# Patient Record
Sex: Female | Born: 1955 | Race: White | Hispanic: No | Marital: Married | Smoking: Never smoker
Health system: Southern US, Academic
[De-identification: ages and names within clinical notes are randomized; demographics above are authoritative.]

## PROBLEM LIST (undated history)

## (undated) HISTORY — PX: HX BREAST BIOPSY: SHX20

## (undated) HISTORY — PX: HX HYSTERECTOMY: SHX81

## (undated) HISTORY — PX: REPLACEMENT TOTAL KNEE BILATERAL: SUR1225

---

## 2008-05-19 ENCOUNTER — Other Ambulatory Visit: Payer: Self-pay

## 2008-05-23 LAB — HISTORICAL CYTOPATHOLOGY-GYN (PAP AND HPV TESTS)

## 2009-05-22 ENCOUNTER — Other Ambulatory Visit: Payer: Self-pay

## 2009-05-23 LAB — HISTORICAL CYTOPATHOLOGY-GYN (PAP AND HPV TESTS)

## 2010-05-23 ENCOUNTER — Other Ambulatory Visit: Payer: Self-pay

## 2010-05-27 LAB — HISTORICAL CYTOPATHOLOGY-GYN (PAP AND HPV TESTS)

## 2010-09-06 ENCOUNTER — Ambulatory Visit (HOSPITAL_COMMUNITY): Payer: Self-pay | Admitting: Surgery

## 2010-09-10 ENCOUNTER — Other Ambulatory Visit: Payer: Self-pay

## 2010-09-12 LAB — HISTORICAL SURGICAL PATHOLOGY SPECIMEN

## 2011-05-27 LAB — HISTORICAL CYTOPATHOLOGY-GYN (PAP AND HPV TESTS): Final Diagnosis: NEGATIVE

## 2011-06-16 IMAGING — CR DG CERVICAL SPINE COMPLETE 4+V
7 series · 7 of 7 positions shown · non-contrast
Comparison: None available.

CLINICAL DATA: Neck pain.

CERVICAL SPINE - COMPLETE 4+ VIEW

[w c-spine lat *]
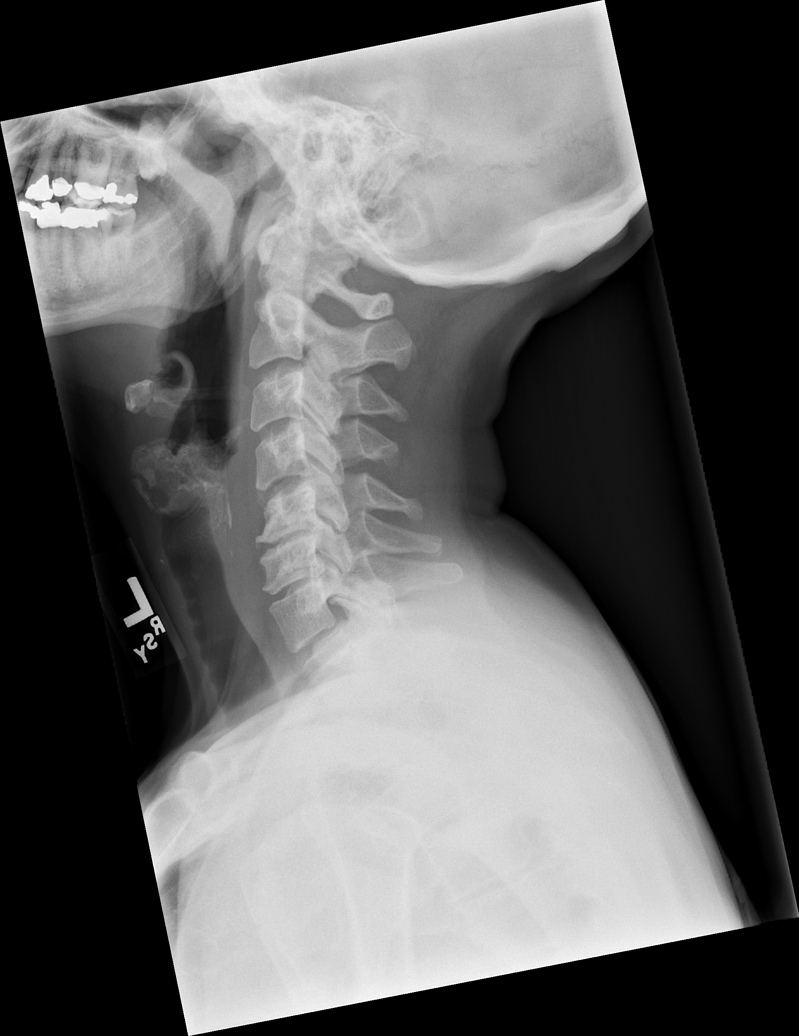

[w c-spine oblique (1 of 2)]
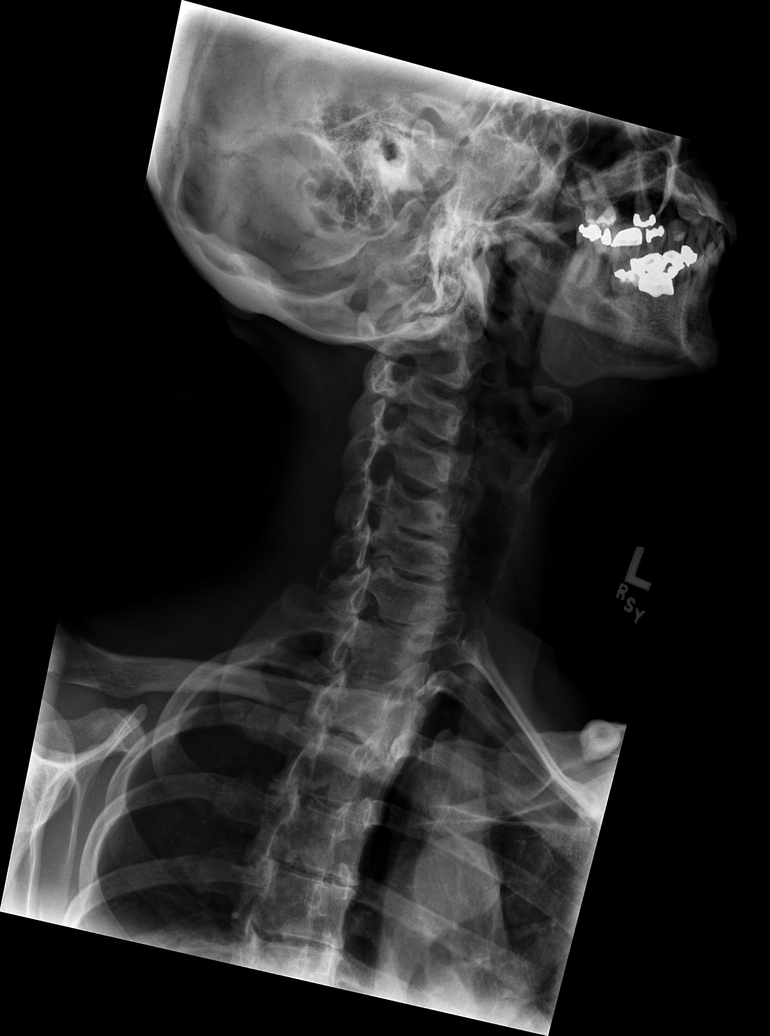

[w c-spine oblique *]
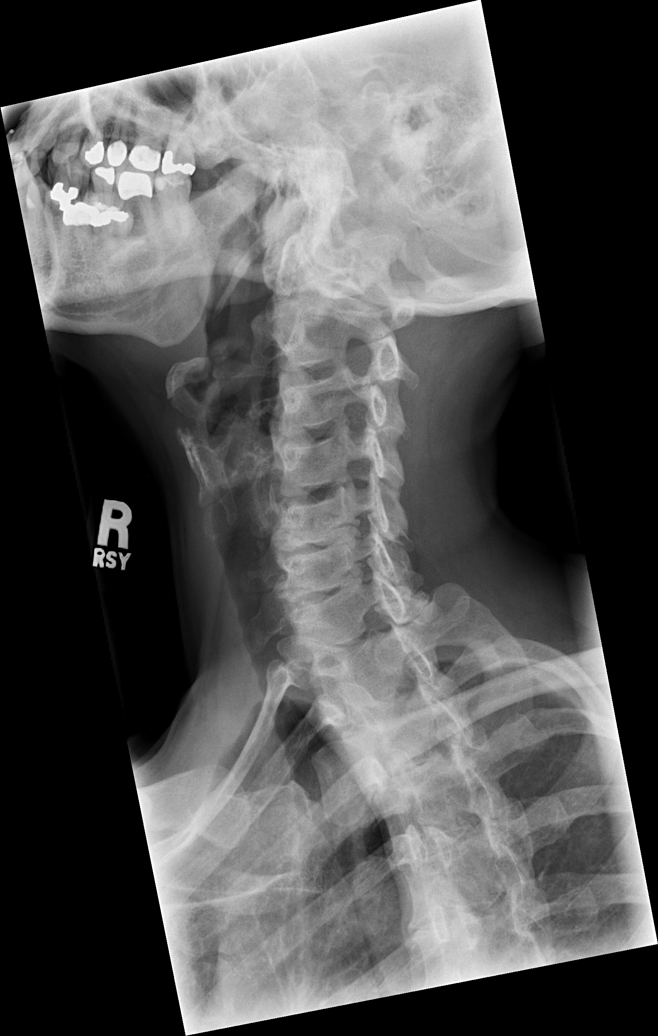

[w c-spine a.p.]
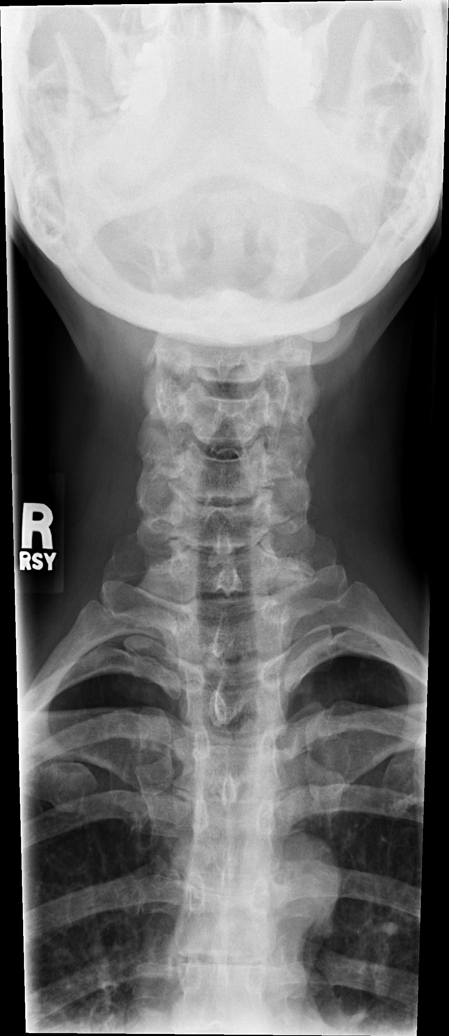

[w c-spine odontoid]
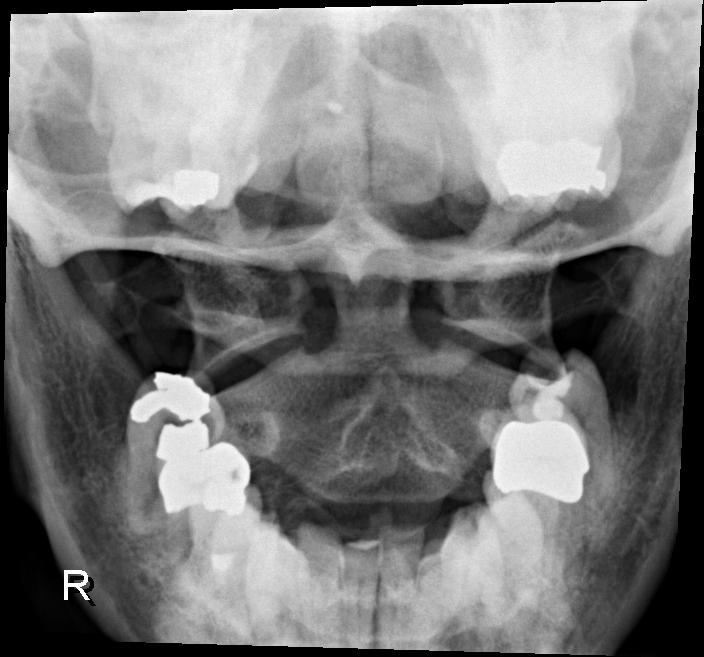

[w swimmers view]
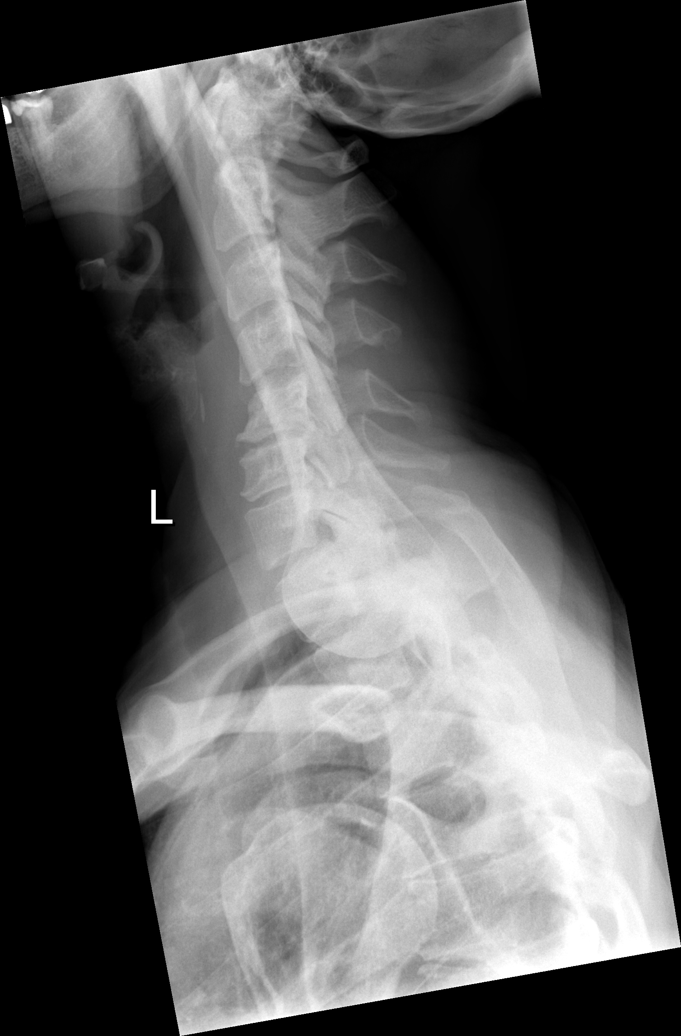

[w c-spine oblique (2 of 2)]
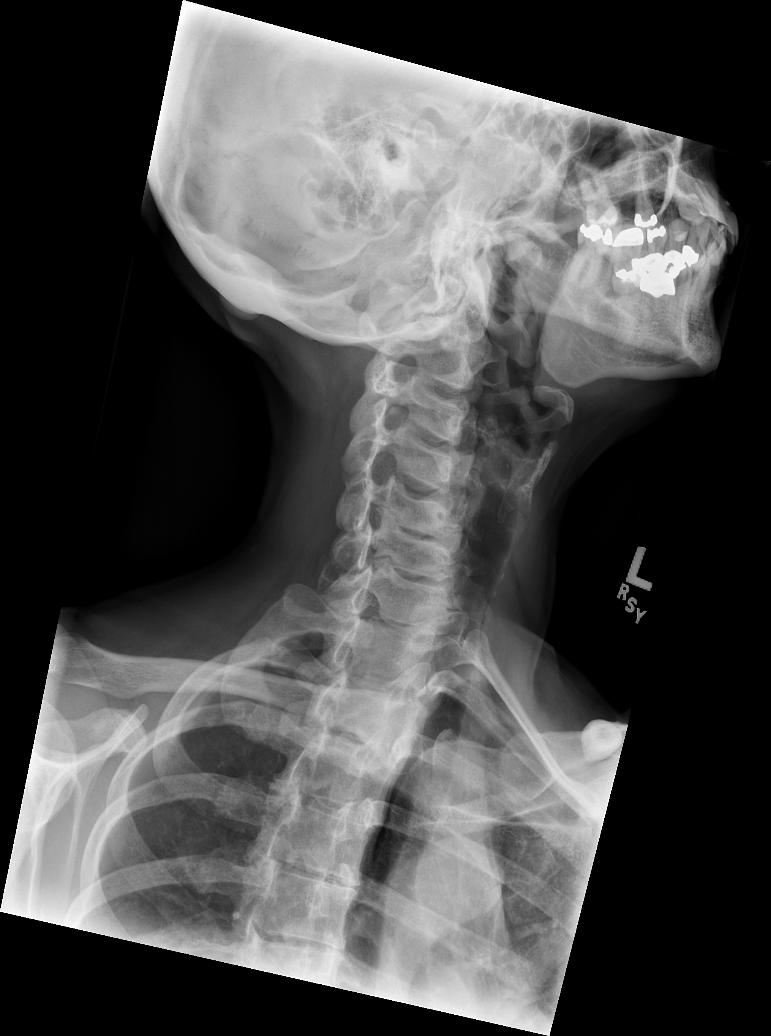

[7 of 7 positions shown; findings below may reference images not displayed]

FINDINGS: Vertebral body height and alignment are maintained.  The
patient has degenerative disease at C5-6 where there is marked loss
of disc space height and endplate spurring.  Uncovertebral disease
is also seen at this level and at C6-7.  Foramina appear narrowed
at both C5-6 and C6-7.
IMPRESSION: 1.  No acute finding.
2.  Cervical spondylosis appearing worst at C5-6 and C6-7.

## 2012-10-27 ENCOUNTER — Other Ambulatory Visit: Payer: Self-pay

## 2012-10-28 LAB — HISTORICAL SURGICAL PATHOLOGY SPECIMEN

## 2016-09-15 ENCOUNTER — Other Ambulatory Visit (HOSPITAL_COMMUNITY): Payer: Self-pay | Admitting: FAMILY PRACTICE

## 2016-09-15 DIAGNOSIS — Z1231 Encounter for screening mammogram for malignant neoplasm of breast: Secondary | ICD-10-CM

## 2016-10-06 ENCOUNTER — Encounter (HOSPITAL_BASED_OUTPATIENT_CLINIC_OR_DEPARTMENT_OTHER): Payer: Self-pay

## 2016-10-06 ENCOUNTER — Ambulatory Visit
Admission: RE | Admit: 2016-10-06 | Discharge: 2016-10-06 | Disposition: A | Discharge status: Home / Self Care | Source: Ambulatory Visit | Attending: FAMILY PRACTICE | Admitting: FAMILY PRACTICE

## 2016-10-06 DIAGNOSIS — Z1231 Encounter for screening mammogram for malignant neoplasm of breast: Secondary | ICD-10-CM

## 2017-09-17 ENCOUNTER — Other Ambulatory Visit (HOSPITAL_COMMUNITY): Payer: Self-pay | Admitting: FAMILY PRACTICE

## 2017-09-17 DIAGNOSIS — Z1239 Encounter for other screening for malignant neoplasm of breast: Secondary | ICD-10-CM

## 2017-10-29 ENCOUNTER — Ambulatory Visit
Admission: RE | Admit: 2017-10-29 | Discharge: 2017-10-29 | Disposition: A | Discharge status: Home / Self Care | Source: Ambulatory Visit | Attending: FAMILY PRACTICE | Admitting: FAMILY PRACTICE

## 2017-10-29 ENCOUNTER — Encounter (HOSPITAL_BASED_OUTPATIENT_CLINIC_OR_DEPARTMENT_OTHER): Payer: Self-pay

## 2017-10-29 DIAGNOSIS — Z1239 Encounter for other screening for malignant neoplasm of breast: Secondary | ICD-10-CM

## 2017-10-29 DIAGNOSIS — Z1231 Encounter for screening mammogram for malignant neoplasm of breast: Secondary | ICD-10-CM | POA: Insufficient documentation

## 2018-02-08 ENCOUNTER — Emergency Department (HOSPITAL_COMMUNITY): Admitting: Radiology

## 2018-02-08 ENCOUNTER — Emergency Department (HOSPITAL_COMMUNITY)

## 2018-02-08 ENCOUNTER — Encounter (HOSPITAL_COMMUNITY): Payer: Self-pay

## 2018-02-08 ENCOUNTER — Emergency Department
Admission: EM | Admit: 2018-02-08 | Discharge: 2018-02-08 | Disposition: A | Discharge status: Home / Self Care | Attending: Internal Medicine | Admitting: Internal Medicine

## 2018-02-08 ENCOUNTER — Other Ambulatory Visit: Payer: Self-pay

## 2018-02-08 DIAGNOSIS — Z96653 Presence of artificial knee joint, bilateral: Secondary | ICD-10-CM | POA: Insufficient documentation

## 2018-02-08 DIAGNOSIS — S8392XA Sprain of unspecified site of left knee, initial encounter: Principal | ICD-10-CM | POA: Insufficient documentation

## 2018-02-08 DIAGNOSIS — Z880 Allergy status to penicillin: Secondary | ICD-10-CM | POA: Insufficient documentation

## 2018-02-08 MED ORDER — TRAMADOL 50 MG TABLET
1.0000 | ORAL_TABLET | Freq: Four times a day (QID) | ORAL | 0 refills | Status: DC | PRN
Start: 2018-02-08 — End: 2023-04-07

## 2018-02-08 MED ORDER — IBUPROFEN 800 MG TABLET
800.0000 mg | ORAL_TABLET | Freq: Three times a day (TID) | ORAL | 0 refills | Status: DC | PRN
Start: 2018-02-08 — End: 2023-04-07

## 2018-02-08 NOTE — ED Provider Notes (Signed)
Emergency Department  Provider Note  HPI - 02/08/2018    Name: Mary Briggs  Age and Gender: 62 y.o. female  Attending: Dr. Yong Channel  APP: Kirke Shaggy PA-C  Scribe: Mickey Farber    Chief Complaint   Patient presents with   . Knee Pain       HPI:    Entered the patient's room for initial exam at 1025 hours.    Mary Briggs is a 62 y.o. female  who presents to the ED today for left knee pain. Patient reports that she has been experiencing left knee pain since this morning. She states that she did work outside yesterday and has concern that she may have injured the knee while doing so. She has had bilateral knee replacements. She does not have any other complaints or concerns at this time.    Please see the patient's PMH below:  Medical  Not on file   Surgical   HX BREAST BIOPSY (Right)    HX HYSTERECTOMY    Replacement total knee bilateral      PCP: Kerry Hough, MD     Location: left knee  Quality: pain  Onset: this morning  Timing: constant  Context: patient has been experiencing left knee pain    History provided by: patient     Review of Systems:    Constitutional: No fever, chills  Skin: No rashes or lesions  HENT: No sore throat, ear pain, or difficulty swallowing  Eyes: No vision changes, redness, discharge  Cardio: No chest pain, palpitations   Respiratory: No cough, wheezing or SOB  GI:  No nausea or vomiting. No diarrhea or constipation. No abdominal pain  GU:  No dysuria, hematuria, polyuria  MSK: +Left knee pain. No neck or back pain  Neuro: No numbness, tingling, or weakness.  No headache  All other systems reviewed and are negative, unless commented on in the HPI.       Below information reviewed with patient:   Current Outpatient Medications   Medication Sig   . Ibuprofen (MOTRIN) 800 mg Oral Tablet Take 1 Tab (800 mg total) by mouth Three times a day as needed for Pain   . traMADol (ULTRAM) 50 mg Oral Tablet Take 1-2 Tabs (50-100 mg total) by mouth Every 6 hours as needed for Pain  (Left knee strain)       Allergies   Allergen Reactions   . Penicillins           Past Medical History:  History reviewed. No pertinent past medical history.    Past Surgical History:  Past Surgical History:   Procedure Laterality Date   . Hx breast biopsy Right    . Hx hysterectomy     . Replacement total knee bilateral         Social History:  Social History     Tobacco Use   . Smoking status: Never Smoker   . Smokeless tobacco: Never Used   Substance Use Topics   . Alcohol use: Never     Frequency: Never   . Drug use: Never     Social History     Substance and Sexual Activity   Drug Use Never       Family History:  No family history on file.      Old records reviewed.      Objective:  Nursing notes reviewed    Filed Vitals:    02/08/18 1021   BP: (!) 141/89  Pulse: 81   Resp: 16   Temp: 36.5 C (97.7 F)   SpO2: 97%       Physical Exam  Nursing note and vitals reviewed.  Vital signs reviewed as above.     Constitutional: Pt is well-developed and well-nourished.   Head: Normocephalic and atraumatic.   Eyes/ Ears/ Throat: Conjunctivae are normal. Pupils are equal, round, and reactive to light. EOM are intact. TMs normal. No pharyngeal erythema.  Neck: Soft, supple, full range of motion.  Cardiovascular: RRR. No Murmurs/rubs/gallops. Distal pulses present and equal bilaterally.  Pulmonary/Chest: Normal BS BL with no distress. No audible wheezes or crackles are noted.  GI/ Abdominal: Soft, nontender, nondistended. No rebound, guarding, or masses.  Musculoskeletal: +Tenderness and swelling to the left knee. Good pulse and motor sensroy distally. Negative Homans sign. No deformities.   Neurological: CNs 2-12 grossly intact. No focal deficits noted.  Skin: Warm and dry. No rash or lesions    Work-up:  Orders Placed This Encounter   . XR KNEE LEFT 4 OR MORE VIEWS   . Ibuprofen (MOTRIN) 800 mg Oral Tablet   . traMADol (ULTRAM) 50 mg Oral Tablet     Imaging:  Results for orders placed or performed during the hospital  encounter of 02/08/18 (from the past 72 hour(s))   XR KNEE LEFT 4 OR MORE VIEWS     Status: None    Narrative    Female, 62 years old.    XR KNEE LEFT 4 OR MORE VIEWS performed on 02/08/2018 11:13 AM.    REASON FOR EXAM:  injury    TECHNIQUE: 4 views/4 images submitted for interpretation.    COMPARISON:  None.    FINDINGS:  4 views of the left knee were obtained. The patient has  undergone prior medial compartment arthroplasty. Prosthetic components are  well aligned. No evidence of failure. There is joint space narrowing with  marginal osteophytic spurring in the patellofemoral and lateral  compartments. The soft tissues are unremarkable.      Impression    No acute osseous abnormality.        Radiologist location ID: [id number]       Plan: Appropriate imaging ordered. Medical Records reviewed.    MDM:   During the patient's stay in the emergency department, the above listed information was used to assist with medical decision making and were reviewed by myself when available for review.      Pt remained stable throughout the emergency department course.  Patient is alert, afebrile, ambulatory and nontoxic.    Impression:   Encounter Diagnosis   Name Primary?   . Left knee sprain Yes     Disposition:  Discharged      Medication instructions were discussed with the patient   It was advised that the patient return to the ED with any new, concerning or worsening symptoms and follow up as directed.    The patient verbalized understanding of all instructions and had no further questions or concerns.     Follow up:   Antoine PrimasMcElroy, J Jeffrey, MD  [address]  [address]  [place] [id number]  [phone]    Schedule an appointment as soon as possible for a visit       Prescriptions:   Current Discharge Medication List      START taking these medications    Details   Ibuprofen (MOTRIN) 800 mg Oral Tablet Take 1 Tab (800 mg total) by mouth Three times a day as needed  for Pain  Qty: 30 Tab, Refills: 0      traMADol (ULTRAM)  50 mg Oral Tablet Take 1-2 Tabs (50-100 mg total) by mouth Every 6 hours as needed for Pain (Left knee strain)  Qty: 12 Tab, Refills: 0           I am scribing for, and in the presence of, Scott VanFossen PA-C for services provided on 02/08/2018.  Mickey Farber, SCRIBE     Mickey Farber, SCRIBE  02/08/2018, 10:15    I personally performed the services described in this documentation, as scribed  in my presence, and it is both accurate  and complete.  Stormy Card, PA-C  The co-signing faculty was physically present in the emergency department and available for consultation and did not particpate in the care of this patient.  William Hamburger Vanfossen, PA-C  02/09/2018, 10:13

## 2018-02-08 NOTE — Discharge Instructions (Signed)
Please return if worsening.  Please make follow up appointment.

## 2018-02-08 NOTE — ED Triage Notes (Signed)
Patient complains of left knee pain that started this morning. States she was working outside yesterday and does not know if she injured while working. Hx of bilateral knee replacement.

## 2018-02-25 ENCOUNTER — Ambulatory Visit (INDEPENDENT_AMBULATORY_CARE_PROVIDER_SITE_OTHER): Admitting: Surgical

## 2018-02-25 ENCOUNTER — Encounter (INDEPENDENT_AMBULATORY_CARE_PROVIDER_SITE_OTHER): Payer: Self-pay | Admitting: Surgical

## 2018-02-25 VITALS — BP 146/90 | HR 59 | Ht 63.0 in | Wt 168.8 lb

## 2018-02-25 DIAGNOSIS — Z96659 Presence of unspecified artificial knee joint: Secondary | ICD-10-CM

## 2018-02-25 DIAGNOSIS — M25562 Pain in left knee: Secondary | ICD-10-CM

## 2018-02-25 DIAGNOSIS — Z6829 Body mass index (BMI) 29.0-29.9, adult: Secondary | ICD-10-CM

## 2018-02-25 NOTE — Progress Notes (Signed)
[facility]  [address]  [place] [facility] [place] [id number]    Progress Note    Name: Mary CellarDebbie G Vandoren MRN:  [id number]   Date: 02/25/2018 Age: 62 y.o.         Reason for Visit:  Left knee pain    History of Present Illness:  Mary Briggs is a 62 y.o. 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  Patient has a history of left unicompartmental knee arthroplasty performed on 12/03/2006 by Dr. Katherene PontoMcElroy.  Patient states she was doing some external painting and was squatting up and down and she had a sudden pain in her left knee lateral aspect that radiated up to her hip.  She states it felt like a "charley horse that would not let up".  She states the next day she followed up in the emergency room and x-rays were performed and showed intact implants of her left knee.  States pain is improving but still having occasional pain which is localized more in the posterior knee.  She has had no recent swelling, erythema, or fever or chills.    Medical History   I have reviewed and updated as appropriate the past medical, surgical, family and social history today:    Past medical history:  Knee osteoarthritis     Past Surgical History:   Procedure Laterality Date   . HX BREAST BIOPSY Right     benign   . HX HYSTERECTOMY     . REPLACEMENT TOTAL KNEE BILATERAL       Current Outpatient Medications   Medication Sig   . Ibuprofen (MOTRIN) 800 mg Oral Tablet Take 1 Tab (800 mg total) by mouth Three times a day as needed for Pain   . traMADol (ULTRAM) 50 mg Oral Tablet Take 1-2 Tabs (50-100 mg total) by mouth Every 6 hours as needed for Pain (Left knee strain)     Allergies   Allergen Reactions   . Penicillins      Family Medical History:     None            Social History     Tobacco Use   . Smoking status: Never Smoker   . Smokeless tobacco: Never Used   Substance Use Topics   . Alcohol use: Never     Frequency: Never   . Drug use: Never       Review of Systems:  Constitutional: no fever,  chills, or unexpected weight loss or weight gain   HEENT: no vision problem, earaches or sore throat  Respiratory: no cough or wheeze  Cardiovascular: no chest pain, palpitations or edema  Gastrointestinal: no abdominal pain, nausea, vomiting or diarrhea   Neurological: no headaches or weakness  Psych: no anxiety or depression  All other ROS Negative    Objective     Physical Exam:  BP (!) 146/90   Pulse 59   Ht 1.6 m (5\' 3" )   Wt 76.6 kg (168 lb 12.8 oz)   BMI 29.90 kg/m         Constitutional: Patient is oriented to person, place, and time and well-developed, well-nourished, and in no distress    Musculoskeletal:  examination of the left knee reveals a well-healed incision.  There is no erythema or increased warmth or effusion noted of the left knee.  No significant tenderness is noted of the left knee presently.  Full left knee range of motion without pain.  Ligamentously stable.  No pain with McMurray's test laterally.    Neurological: Patient is alert and oriented to person, place, and time. Gait normal.     Skin: Skin is warm and dry. No rash noted. No erythema.     Psychiatric: Mood, memory and affect normal.       Imaging:   Four view x-ray of the left knee dated 02/08/2018 [facility] which showed intact unicompartmental knee implants.  Mild osteophytes of the patellofemoral and lateral compartments but overall well maintained lateral and patellofemoral joint spaces.    Assessment     ENCOUNTER DIAGNOSES     ICD-10-CM   1. Left knee pain M25.562   2. History of partial knee replacement Z96.659        Plan     Patient will monitor her left knee for continued pain or recurrence of significant pain.  She is advised to monitor knee for effusion, erythema or increased warmth.    Follow up: Return if symptoms worsen or fail to improve.    I am seeing this patient independently with co-signing supervising physician present in clinic.    Jefm BryantBrook E. Morgan, PA-C        This note was partially generated  using MModal Fluency Direct system, and there may be some incorrect words, spellings, and punctuation that were not noted in checking the note before saving

## 2018-09-23 ENCOUNTER — Other Ambulatory Visit (HOSPITAL_COMMUNITY): Payer: Self-pay | Admitting: FAMILY PRACTICE

## 2018-09-23 DIAGNOSIS — Z1231 Encounter for screening mammogram for malignant neoplasm of breast: Secondary | ICD-10-CM

## 2019-12-21 ENCOUNTER — Ambulatory Visit
Admission: RE | Admit: 2019-12-21 | Discharge: 2019-12-21 | Disposition: A | Discharge status: Home / Self Care | Source: Ambulatory Visit | Attending: FAMILY PRACTICE | Admitting: FAMILY PRACTICE

## 2019-12-21 ENCOUNTER — Other Ambulatory Visit: Payer: Self-pay

## 2019-12-21 DIAGNOSIS — Z1231 Encounter for screening mammogram for malignant neoplasm of breast: Secondary | ICD-10-CM | POA: Insufficient documentation

## 2019-12-24 ENCOUNTER — Encounter

## 2020-12-19 ENCOUNTER — Other Ambulatory Visit (HOSPITAL_COMMUNITY): Payer: Self-pay | Admitting: NURSE PRACTITIONER

## 2020-12-19 DIAGNOSIS — Z1231 Encounter for screening mammogram for malignant neoplasm of breast: Secondary | ICD-10-CM

## 2020-12-19 DIAGNOSIS — M810 Age-related osteoporosis without current pathological fracture: Secondary | ICD-10-CM

## 2021-01-17 ENCOUNTER — Ambulatory Visit (HOSPITAL_BASED_OUTPATIENT_CLINIC_OR_DEPARTMENT_OTHER): Payer: Medicare Other

## 2021-01-17 ENCOUNTER — Other Ambulatory Visit: Payer: Self-pay

## 2021-01-17 ENCOUNTER — Encounter (HOSPITAL_BASED_OUTPATIENT_CLINIC_OR_DEPARTMENT_OTHER): Payer: Self-pay

## 2021-01-17 ENCOUNTER — Ambulatory Visit
Admission: RE | Admit: 2021-01-17 | Discharge: 2021-01-17 | Disposition: A | Discharge status: Home / Self Care | Payer: Medicare Other | Source: Ambulatory Visit | Attending: NURSE PRACTITIONER | Admitting: NURSE PRACTITIONER

## 2021-01-17 DIAGNOSIS — Z1231 Encounter for screening mammogram for malignant neoplasm of breast: Secondary | ICD-10-CM | POA: Insufficient documentation

## 2021-01-17 DIAGNOSIS — M810 Age-related osteoporosis without current pathological fracture: Secondary | ICD-10-CM | POA: Insufficient documentation

## 2021-04-03 ENCOUNTER — Other Ambulatory Visit (INDEPENDENT_AMBULATORY_CARE_PROVIDER_SITE_OTHER): Payer: Self-pay | Admitting: Specialist

## 2021-04-03 DIAGNOSIS — Z96653 Presence of artificial knee joint, bilateral: Secondary | ICD-10-CM

## 2021-04-03 NOTE — Telephone Encounter (Signed)
Patient is scheduled for dental procedure s/p TKA, bilateral.     Harrie Foreman, MA  04/03/2021, 16:52

## 2021-04-04 MED ORDER — CLINDAMYCIN HCL 300 MG CAPSULE
1200.00 mg | ORAL_CAPSULE | Freq: Once | ORAL | 0 refills | Status: AC
Start: 2021-04-04 — End: 2021-04-04

## 2021-09-16 ENCOUNTER — Other Ambulatory Visit (INDEPENDENT_AMBULATORY_CARE_PROVIDER_SITE_OTHER): Payer: Self-pay | Admitting: Specialist

## 2021-09-16 MED ORDER — CLINDAMYCIN HCL 300 MG CAPSULE
ORAL_CAPSULE | ORAL | 4 refills | Status: DC
Start: 2021-09-16 — End: 2023-04-07

## 2021-09-16 NOTE — Telephone Encounter (Signed)
Patient is having a dental procedure tomorrow.    Thanks!    Denton Meek, MA

## 2022-03-12 ENCOUNTER — Other Ambulatory Visit (HOSPITAL_COMMUNITY): Payer: Self-pay | Admitting: NURSE PRACTITIONER

## 2022-03-12 DIAGNOSIS — Z1231 Encounter for screening mammogram for malignant neoplasm of breast: Secondary | ICD-10-CM

## 2022-03-20 ENCOUNTER — Inpatient Hospital Stay
Admission: RE | Admit: 2022-03-20 | Discharge: 2022-03-20 | Disposition: A | Discharge status: Home / Self Care | Payer: Medicare Other | Source: Ambulatory Visit | Attending: NURSE PRACTITIONER | Admitting: NURSE PRACTITIONER

## 2022-03-20 ENCOUNTER — Other Ambulatory Visit: Payer: Self-pay

## 2022-03-20 DIAGNOSIS — Z1231 Encounter for screening mammogram for malignant neoplasm of breast: Secondary | ICD-10-CM

## 2023-01-07 ENCOUNTER — Other Ambulatory Visit (HOSPITAL_COMMUNITY): Payer: Self-pay | Admitting: NURSE PRACTITIONER

## 2023-01-07 DIAGNOSIS — R600 Localized edema: Secondary | ICD-10-CM

## 2023-01-08 ENCOUNTER — Other Ambulatory Visit (HOSPITAL_COMMUNITY): Payer: Self-pay | Admitting: NURSE PRACTITIONER

## 2023-01-08 DIAGNOSIS — M8589 Other specified disorders of bone density and structure, multiple sites: Secondary | ICD-10-CM

## 2023-01-08 DIAGNOSIS — Z1231 Encounter for screening mammogram for malignant neoplasm of breast: Secondary | ICD-10-CM

## 2023-01-21 ENCOUNTER — Ambulatory Visit
Admission: RE | Admit: 2023-01-21 | Discharge: 2023-01-21 | Disposition: A | Discharge status: Home / Self Care | Payer: Medicare Other | Source: Ambulatory Visit | Attending: NURSE PRACTITIONER | Admitting: NURSE PRACTITIONER

## 2023-01-21 ENCOUNTER — Other Ambulatory Visit: Payer: Self-pay

## 2023-01-21 DIAGNOSIS — I361 Nonrheumatic tricuspid (valve) insufficiency: Secondary | ICD-10-CM

## 2023-01-21 DIAGNOSIS — R600 Localized edema: Secondary | ICD-10-CM | POA: Insufficient documentation

## 2023-01-21 DIAGNOSIS — I340 Nonrheumatic mitral (valve) insufficiency: Secondary | ICD-10-CM

## 2023-01-22 ENCOUNTER — Encounter (HOSPITAL_BASED_OUTPATIENT_CLINIC_OR_DEPARTMENT_OTHER): Payer: Self-pay | Admitting: Cardiovascular Disease

## 2023-01-22 NOTE — Nursing Note (Signed)
Echo results faxed to ordering provider at this time.  Danylah Holden, RN

## 2023-01-28 ENCOUNTER — Other Ambulatory Visit (HOSPITAL_COMMUNITY): Payer: Self-pay | Admitting: NURSE PRACTITIONER

## 2023-01-28 DIAGNOSIS — R931 Abnormal findings on diagnostic imaging of heart and coronary circulation: Secondary | ICD-10-CM

## 2023-01-28 DIAGNOSIS — I5189 Other ill-defined heart diseases: Secondary | ICD-10-CM

## 2023-01-28 DIAGNOSIS — I071 Rheumatic tricuspid insufficiency: Secondary | ICD-10-CM

## 2023-01-28 DIAGNOSIS — I340 Nonrheumatic mitral (valve) insufficiency: Secondary | ICD-10-CM

## 2023-03-23 ENCOUNTER — Other Ambulatory Visit: Payer: Self-pay

## 2023-03-23 ENCOUNTER — Inpatient Hospital Stay
Admission: RE | Admit: 2023-03-23 | Discharge: 2023-03-23 | Disposition: A | Discharge status: Home / Self Care | Payer: Medicare Other | Source: Ambulatory Visit | Attending: NURSE PRACTITIONER

## 2023-03-23 ENCOUNTER — Other Ambulatory Visit (HOSPITAL_COMMUNITY): Payer: Self-pay | Admitting: NURSE PRACTITIONER

## 2023-03-23 ENCOUNTER — Other Ambulatory Visit (HOSPITAL_BASED_OUTPATIENT_CLINIC_OR_DEPARTMENT_OTHER): Payer: Medicare Other | Admitting: Radiology

## 2023-03-23 DIAGNOSIS — M8589 Other specified disorders of bone density and structure, multiple sites: Secondary | ICD-10-CM

## 2023-03-23 DIAGNOSIS — Z1231 Encounter for screening mammogram for malignant neoplasm of breast: Secondary | ICD-10-CM | POA: Insufficient documentation

## 2023-04-07 ENCOUNTER — Encounter (HOSPITAL_BASED_OUTPATIENT_CLINIC_OR_DEPARTMENT_OTHER): Payer: Self-pay | Admitting: Interventional Cardiology

## 2023-04-07 ENCOUNTER — Ambulatory Visit (HOSPITAL_BASED_OUTPATIENT_CLINIC_OR_DEPARTMENT_OTHER): Payer: Medicare Other | Admitting: Interventional Cardiology

## 2023-04-07 ENCOUNTER — Other Ambulatory Visit: Payer: Self-pay

## 2023-04-07 DIAGNOSIS — I071 Rheumatic tricuspid insufficiency: Secondary | ICD-10-CM

## 2023-04-07 DIAGNOSIS — I340 Nonrheumatic mitral (valve) insufficiency: Secondary | ICD-10-CM

## 2023-04-07 DIAGNOSIS — R931 Abnormal findings on diagnostic imaging of heart and coronary circulation: Secondary | ICD-10-CM

## 2023-04-07 DIAGNOSIS — I5189 Other ill-defined heart diseases: Secondary | ICD-10-CM

## 2023-04-07 DIAGNOSIS — I371 Nonrheumatic pulmonary valve insufficiency: Secondary | ICD-10-CM

## 2023-04-07 LAB — ECG W INTERP (AMB USE ONLY)(MUSE,IN CLINIC)
Atrial Rate: 54 {beats}/min
Calculated P Axis: 66 degrees
Calculated R Axis: 32 degrees
Calculated T Axis: 46 degrees
PR Interval: 174 ms
QRS Duration: 92 ms
QT Interval: 398 ms
QTC Calculation: 377 ms
Ventricular rate: 54 {beats}/min

## 2023-04-07 NOTE — H&P (Signed)
Cardiology Consult Note  [facility]  [address]  [place] [address]  [phone]      Name: Mary Briggs                       Date of Birth: [birth year]   MRN:  [id number]                         Date of visit: 04/07/2023     PCP: Floyde Parkins, NP     Reason for Consult:  Abnormal echocardiogram    HOPI:        Mary Briggs is a  67 y.o. year old female who underwent an echocardiogram for edema.  She is a retired [employer] fundraiser.  She recently was diagnosed with hypertension was started on losartan 50 mg daily.  Subsequently she noticed she has some lower extremity edema and was started on Lasix as needed.  Because of her edema she was recommended echocardiogram    No previous history of cardiovascular disease.   She is physically active and walks 2-3 miles a day with no limitations  Her cardiovascular risk factors are significant for obesity, hypertension.  Her recent LDL is 102 with a ASCVD risk score of 8.5%  No history of diabetes or smoking    Echo was interpreted as low normal LV function, global hypokinesis mild MR  Moderate TR and mild global hypokinesis    I reviewed the films.  EF appeared to be preserved at 50-55% with no wall motion abnormalities.  Mild-to-moderate TR noted with normal RVSP    She denies any chest pain, palpitations, dizziness, loss of consciousness or shortness of breath with exertion    Past medical history:  Hypertension       Past Surgical History:   Procedure Laterality Date    HX BREAST BIOPSY Right     benign    HX HYSTERECTOMY      REPLACEMENT TOTAL KNEE BILATERAL             Current Outpatient Medications   Medication Sig    aspirin (ECOTRIN) 81 mg Oral Tablet, Delayed Release (E.C.) Take 1 Tablet (81 mg total) by mouth Once a day    calcium carbonate (TUMS) 200 mg calcium (500 mg) Oral Tablet, Chewable Chew 1 Tablet (500 mg total) Once a day    clindamycin (CLEOCIN) 300 mg Oral Capsule Take all four tablets one hour prior to the procedure.  (Patient not taking: Reported on 04/07/2023)    furosemide (LASIX) 20 mg Oral Tablet TAKE 1 TABLET BY MOUTH EVERY DAY AS NEEDED FOR SWELLING    Ibuprofen (MOTRIN) 800 mg Oral Tablet Take 1 Tab (800 mg total) by mouth Three times a day as needed for Pain (Patient not taking: Reported on 04/07/2023)    losartan (COZAAR) 50 mg Oral Tablet Take 1 Tablet (50 mg total) by mouth Once a day    multivitamin (MULTIPLE VITAMINS) Oral Tablet Take 1 Tablet by mouth Once a day    traMADol (ULTRAM) 50 mg Oral Tablet Take 1-2 Tabs (50-100 mg total) by mouth Every 6 hours as needed for Pain (Left knee strain) (Patient not taking: Reported on 04/07/2023)    VITAMIN B COMPLEX ORAL Vitamin B Complex Active 1 TAB PO Daily 90 March 12th, 2020 9:58am    zinc Sulfate (ZINCATE) 50 mg zinc (220 mg) Oral Tablet Take 1 Tablet (50 mg total)  by mouth       Allergies   Allergen Reactions    Penicillins        Family Medical History:    None           Social History     Tobacco Use    Smoking status: Never    Smokeless tobacco: Never   Substance Use Topics    Alcohol use: Never        REVIEW OF SYSTEMS:  Constitutional: No fever.   Respiratory: No shortness of breath  Cardiovascular: No chest pain palpitations, lower extremity edema.    All other systems reviewed and are negative except as noted in HOPI.    PHYSICAL EXAMINATION:     Vitals reviewed. BP 122/82 Comment: RTA  Pulse 80   Ht 1.626 m (5\' 4" )   Wt 84.8 kg (187 lb)   SpO2 97%   BMI 32.10 kg/m       Vitals Filed         04/07/2023  1253 04/07/2023  1254          BP: 120/84 122/82      Pulse: 80 --              Constitutional: She is oriented to person, place, and time.   Cardiovascular: Normal rate and rhythm without murmurs.  No JVD.  No peripheral edema is noted.  Pulmonary/Chest: Lungs clear.    Abdominal: Soft. Nontender, non distended,no organomegaly.    Labs:     No results found for this or any previous visit (from the past 24 hour(s)).    Labs:   No results found for: "HA1C",  "MICALBCREAT", "LDLCHOL", "TRIG", "HDLCHOL", "CHOLESTEROL" No results found for: "SODIUM", "POTASSIUM", "CHLORIDE", "CO2", "BUN", "CREATININE", "GFR", "CALCIUM"      Imaging:    Assessment:    Assessment/Plan   1. Abnormal echocardiogram    2. Mild mitral valve regurgitation    3. Hypokinesia of left ventricle    4. Moderate tricuspid regurgitation             Plan:  She has no clinical signs or symptoms of congestive heart  No Symptoms of angina  Heart rate and blood pressures are well controlled  She has a essentially normal LV systolic function  I recommended to repeat an echocardiogram in 1 year to assess the valvular abnormalities though in my opinion there appeared to be mild.  She does have diastolic dysfunction.  Use Lasix as needed    Encouraged her to lose weight, continue exercise and healthy diet   follow up with me as needed    Thank you very much for involving me in the care of this patient.  Please feel free to call me if I can be of any further assistance.    Electronically signed by     Aldona Bar, MD  04/07/2023, 13:07      This note may have been partially generated using MModal Fluency Direct system, and there may be some incorrect words, spellings, and punctuation that were not noted in checking the note before saving, though effort was made to avoid such errors.

## 2023-08-18 ENCOUNTER — Other Ambulatory Visit (INDEPENDENT_AMBULATORY_CARE_PROVIDER_SITE_OTHER): Payer: Self-pay | Admitting: Specialist

## 2023-08-18 DIAGNOSIS — M170 Bilateral primary osteoarthritis of knee: Secondary | ICD-10-CM

## 2023-08-18 NOTE — Telephone Encounter (Signed)
Keflex

## 2023-08-18 NOTE — Telephone Encounter (Signed)
Patient called requesting a ABX for a dentist appt.    Surgery was two knee surgeries     Dropped if okay to send     Robby Sermon, CMA

## 2023-09-18 ENCOUNTER — Other Ambulatory Visit (INDEPENDENT_AMBULATORY_CARE_PROVIDER_SITE_OTHER): Payer: Self-pay | Admitting: Specialist

## 2023-09-18 DIAGNOSIS — M170 Bilateral primary osteoarthritis of knee: Secondary | ICD-10-CM

## 2023-09-18 NOTE — Telephone Encounter (Signed)
 Patient has had HX of Bilateral total knee Arthroplasty. Patient is having an upcoming dentist appt.     Clindamycin 150 mg take 4 tabs 30-60 minutes before dental procedure.     Helene Kelp, [place]

## 2023-09-20 MED ORDER — CLINDAMYCIN HCL 150 MG CAPSULE: ORAL_CAPSULE | ORAL | 3 refills | Status: DC

## 2023-12-01 ENCOUNTER — Encounter (INDEPENDENT_AMBULATORY_CARE_PROVIDER_SITE_OTHER): Payer: Self-pay

## 2024-01-13 ENCOUNTER — Other Ambulatory Visit (HOSPITAL_COMMUNITY): Payer: Self-pay | Admitting: NURSE PRACTITIONER

## 2024-01-13 DIAGNOSIS — Z1231 Encounter for screening mammogram for malignant neoplasm of breast: Secondary | ICD-10-CM

## 2024-01-14 ENCOUNTER — Other Ambulatory Visit (HOSPITAL_BASED_OUTPATIENT_CLINIC_OR_DEPARTMENT_OTHER): Payer: Self-pay

## 2024-01-14 DIAGNOSIS — I340 Nonrheumatic mitral (valve) insufficiency: Secondary | ICD-10-CM

## 2024-02-23 ENCOUNTER — Ambulatory Visit
Admission: RE | Admit: 2024-02-23 | Discharge: 2024-02-23 | Disposition: A | Discharge status: Home / Self Care | Payer: Self-pay | Source: Ambulatory Visit | Attending: Interventional Cardiology | Admitting: Interventional Cardiology

## 2024-02-23 ENCOUNTER — Other Ambulatory Visit: Payer: Self-pay

## 2024-02-23 DIAGNOSIS — I340 Nonrheumatic mitral (valve) insufficiency: Secondary | ICD-10-CM | POA: Insufficient documentation

## 2024-02-23 DIAGNOSIS — I081 Rheumatic disorders of both mitral and tricuspid valves: Secondary | ICD-10-CM

## 2024-02-23 LAB — TRANSTHORACIC ECHOCARDIOGRAM - ADULT
EF VISUAL ESTIMATE: 60
EF: 65

## 2024-03-24 ENCOUNTER — Other Ambulatory Visit: Payer: Self-pay

## 2024-03-24 ENCOUNTER — Ambulatory Visit
Admission: RE | Admit: 2024-03-24 | Discharge: 2024-03-24 | Disposition: A | Discharge status: Home / Self Care | Source: Ambulatory Visit | Attending: NURSE PRACTITIONER | Admitting: NURSE PRACTITIONER

## 2024-03-24 DIAGNOSIS — Z1231 Encounter for screening mammogram for malignant neoplasm of breast: Secondary | ICD-10-CM | POA: Insufficient documentation

## 2024-07-22 ENCOUNTER — Other Ambulatory Visit (INDEPENDENT_AMBULATORY_CARE_PROVIDER_SITE_OTHER): Payer: Self-pay

## 2024-07-22 DIAGNOSIS — Z96653 Presence of artificial knee joint, bilateral: Secondary | ICD-10-CM

## 2024-07-22 MED ORDER — CLINDAMYCIN HCL 300 MG CAPSULE: ORAL_CAPSULE | ORAL | 3 refills | Status: AC

## 2024-07-22 NOTE — Telephone Encounter (Signed)
 Patient called requesting dental antibiotics. HX of Bilateral total knee Arthroplasty. Thank you, Lionel Geralds, RTR
